# Patient Record
Sex: Male | Born: 1985 | Race: White | Hispanic: Yes | Marital: Married
Health system: Western US, Academic
[De-identification: ages and names within clinical notes are randomized; demographics above are authoritative.]

## PROBLEM LIST (undated history)

## (undated) ENCOUNTER — Encounter (HOSPITAL_BASED_OUTPATIENT_CLINIC_OR_DEPARTMENT_OTHER): Payer: Self-pay

## (undated) ENCOUNTER — Ambulatory Visit (HOSPITAL_BASED_OUTPATIENT_CLINIC_OR_DEPARTMENT_OTHER): Payer: Non-veteran care | Admitting: Anesthesiology

## (undated) SURGERY — PAIN TPI (2 OR MORE MUSCLES)
Laterality: Bilateral

---

## 2015-05-05 ENCOUNTER — Encounter (HOSPITAL_BASED_OUTPATIENT_CLINIC_OR_DEPARTMENT_OTHER): Payer: Self-pay | Admitting: Orthopedics

## 2015-05-05 DIAGNOSIS — M25571 Pain in right ankle and joints of right foot: Principal | ICD-10-CM

## 2015-05-16 ENCOUNTER — Ambulatory Visit
Admission: RE | Admit: 2015-05-16 | Discharge: 2015-05-16 | Disposition: A | Discharge status: Home / Self Care | Payer: Non-veteran care | Source: Ambulatory Visit | Attending: Diagnostic Radiology | Admitting: Diagnostic Radiology

## 2015-05-16 DIAGNOSIS — M25571 Pain in right ankle and joints of right foot: Principal | ICD-10-CM | POA: Insufficient documentation

## 2015-05-20 ENCOUNTER — Encounter (HOSPITAL_BASED_OUTPATIENT_CLINIC_OR_DEPARTMENT_OTHER): Payer: Self-pay | Admitting: Orthopedics

## 2015-05-20 DIAGNOSIS — M25571 Pain in right ankle and joints of right foot: Principal | ICD-10-CM

## 2015-06-09 ENCOUNTER — Encounter (HOSPITAL_BASED_OUTPATIENT_CLINIC_OR_DEPARTMENT_OTHER): Payer: Self-pay | Admitting: Internal Medicine

## 2015-06-09 DIAGNOSIS — M25571 Pain in right ankle and joints of right foot: Principal | ICD-10-CM

## 2015-06-12 ENCOUNTER — Ambulatory Visit
Admission: RE | Admit: 2015-06-12 | Discharge: 2015-06-12 | Disposition: A | Discharge status: Home / Self Care | Payer: Non-veteran care | Attending: Diagnostic Radiology | Admitting: Diagnostic Radiology

## 2015-06-12 DIAGNOSIS — M25571 Pain in right ankle and joints of right foot: Principal | ICD-10-CM | POA: Insufficient documentation

## 2016-04-09 ENCOUNTER — Ambulatory Visit (INDEPENDENT_AMBULATORY_CARE_PROVIDER_SITE_OTHER): Payer: Non-veteran care | Admitting: Interventional Pain Management

## 2016-04-27 ENCOUNTER — Ambulatory Visit (INDEPENDENT_AMBULATORY_CARE_PROVIDER_SITE_OTHER): Payer: Non-veteran care | Admitting: Pain Medicine

## 2016-05-18 ENCOUNTER — Ambulatory Visit (HOSPITAL_BASED_OUTPATIENT_CLINIC_OR_DEPARTMENT_OTHER): Payer: Non-veteran care | Admitting: Pain Medicine

## 2016-05-18 ENCOUNTER — Encounter (HOSPITAL_BASED_OUTPATIENT_CLINIC_OR_DEPARTMENT_OTHER): Payer: Self-pay

## 2017-09-26 ENCOUNTER — Telehealth (HOSPITAL_BASED_OUTPATIENT_CLINIC_OR_DEPARTMENT_OTHER): Payer: Self-pay

## 2017-09-26 NOTE — Telephone Encounter (Signed)
Pt is scheduled for consult on 10/17/17 with Dr. Tessie FassAhadian please notify [facility] for [facility].

## 2017-09-28 NOTE — Telephone Encounter (Signed)
Received [facility] approval for Pain Mgmnt services. Approval may not include all procedures. Patient aware.

## 2017-09-28 NOTE — Telephone Encounter (Signed)
Per Erik Barron from [facility] at [phone] advised he will be faxing approval letter.

## 2017-10-17 ENCOUNTER — Encounter (HOSPITAL_BASED_OUTPATIENT_CLINIC_OR_DEPARTMENT_OTHER): Payer: Self-pay | Admitting: Anesthesiology

## 2017-10-17 ENCOUNTER — Ambulatory Visit: Payer: Non-veteran care | Attending: Anesthesiology | Admitting: Anesthesiology

## 2017-10-17 DIAGNOSIS — M7918 Myalgia, other site: Secondary | ICD-10-CM | POA: Insufficient documentation

## 2017-10-17 DIAGNOSIS — S8401XS Injury of tibial nerve at lower leg level, right leg, sequela: Secondary | ICD-10-CM | POA: Insufficient documentation

## 2017-10-17 DIAGNOSIS — G90521 Complex regional pain syndrome I of right lower limb: Secondary | ICD-10-CM | POA: Insufficient documentation

## 2017-10-17 DIAGNOSIS — G5781 Other specified mononeuropathies of right lower limb: Secondary | ICD-10-CM | POA: Insufficient documentation

## 2017-10-17 DIAGNOSIS — S8421XS Injury of cutaneous sensory nerve at lower leg level, right leg, sequela: Secondary | ICD-10-CM | POA: Insufficient documentation

## 2017-10-17 DIAGNOSIS — S81801S Unspecified open wound, right lower leg, sequela: Secondary | ICD-10-CM | POA: Insufficient documentation

## 2017-10-17 NOTE — Patient Instructions (Signed)
Please allow up to 14 business days for the authorization to be processed and you will be contacted to schedule your procedure once it has been approved.    Please call [phone] To Check on Status of Authorization    Please call [phone] option #0 To Schedule Your Procedure once it's appoved    Follow Up Appointment  After procedure     If you have any questions please don't hesitate to call our clinic at [phone].      What is a Trigger Point?  These are tight, dysfunctional, and painful bands of muscle that are often located in the neck, upper back, and lower back.     These trigger points are usually very tender to applied pressure, and can often radiates pain outwards when pressed. Over time, this muscular tension can reduce blood flow to the painful regions and worsen the pain cycle.     This condition is often referred to as myofascial pain syndrome by healthcare providers.     What is a Trigger Point Injection?  A trigger point injection involves the use of a small needle to administer local anesthetic medication directly into painful areas within the muscles. This helps to relax the muscles and break the cycle of dysfunction. Your physician may also use a technique known as dry needling to help break up the tight tissue and stimulate increased blood flow to the area.      In certain cases, your physician may choose to inject Botox (botulinum toxin) into the trigger points. This medication reduces muscle contraction at a molecular level, and can also be very effective in treating myofascial pain.     How Are Trigger Point Injections Performed?  You will be asked to sit or lie in a position in which the affected are be accessible to your physician.     Cleaning solution is applied to site of the pain. Your physician will then use a small needle to inject some local anesthetic medication into each of the painful areas of the affected muscles.     For trigger point injections of certain  structures, such as the piriformis muscle, image guidance with either ultrasound or fluoroscopy (X-ray) will be utilized to ensure accurate placement of the needle.     Risks and Complications:  Trigger point injections are considered very safe in general.     However, as with any minor medical procedure, there are potential risks. This includes soreness, bleeding, bruising, or infection. If the procedure is done in the upper back or chest, then pneumothorax (collapsed lung) is also a potential rare complication.     We will take every measure to minimize these potential risks and maximize the therapeutic benefit.
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
## 2017-10-20 ENCOUNTER — Telehealth (HOSPITAL_BASED_OUTPATIENT_CLINIC_OR_DEPARTMENT_OTHER): Payer: Self-pay | Admitting: Anesthesiology

## 2017-10-20 NOTE — Telephone Encounter (Addendum)
Pt has an order for TPI and previous authorization does not have CPT code [id number] for TPI. A new SAR needs to be requested through [facility].

## 2017-10-20 NOTE — Telephone Encounter (Signed)
Erik Barron from [facility] is asking if clinic can faxed over progress notes to [facility], please advise.

## 2017-10-20 NOTE — Telephone Encounter (Signed)
Pt called to schedule, feels it should have been approved, the order for injection.  Wanted to speak with the auth team.  Transferred to clinic.

## 2017-10-20 NOTE — Telephone Encounter (Signed)
Patient is following up on order placed 10/17/17 for TPI. Pt is under the impression that the procedure is already approved by [facility] from the initial referral. Pt aware that clinic has not submitted auth to insurance, he would like a call back with a status update, he would also like Dr. Tessie FassAhadian to get involved in the process because he states "Dr. Tessie FassAhadian can make calls to certain people to get this approved". Please advise, thank you.

## 2017-10-26 NOTE — Telephone Encounter (Signed)
Notes from 8/26 faxed to Attn. Dekuisha at [phone]

## 2017-12-05 NOTE — Telephone Encounter (Signed)
Spoke to [employer] and asked if the cpt code for TPI can be added in the recent authorization ([id number]). Per agent it is already included in the existing authorization (see below)      Informed agent,cpt code 29528 is not included in the list however.  Also informed agent that the TPI has actually been approved by the [place] from the first referral they sent (see below).  Agent will update current auth to reflect cpt code 41324.  The update Berkley Harvey will be faxed as soon as it's edited.

## 2017-12-08 NOTE — Telephone Encounter (Signed)
Spoke to [employer] M who stated that the correct authorization to use is auth# [id number] and NOT the [id number].    TPI has been APPROVED.    Patient has been notified. Per patient, he already moved and actually discussed this with Dr. Tessie Fass before. Patient inquired as to how he can get his medical records from our Clinic.  Informed him, he needs to sign the ROI.  Advised him to go to the [place] website and choose the link where you can request your records online.    Patient thankful for the assistance.    Closing this encounter, matter resolved.

## 2019-09-22 IMAGING — CR L-SPINE 4 VWS MIN
1 series · 5 of 5 positions shown · non-contrast
Comparison: None

HISTORY/INDICATION:  Lumbar radiculopathy
TECHNIQUE: Lumbar spine, 5 views

[Series 1: t lumbar spine ap · 0.15mm/px · 5 of 5 slices shown]
[im 1/5]
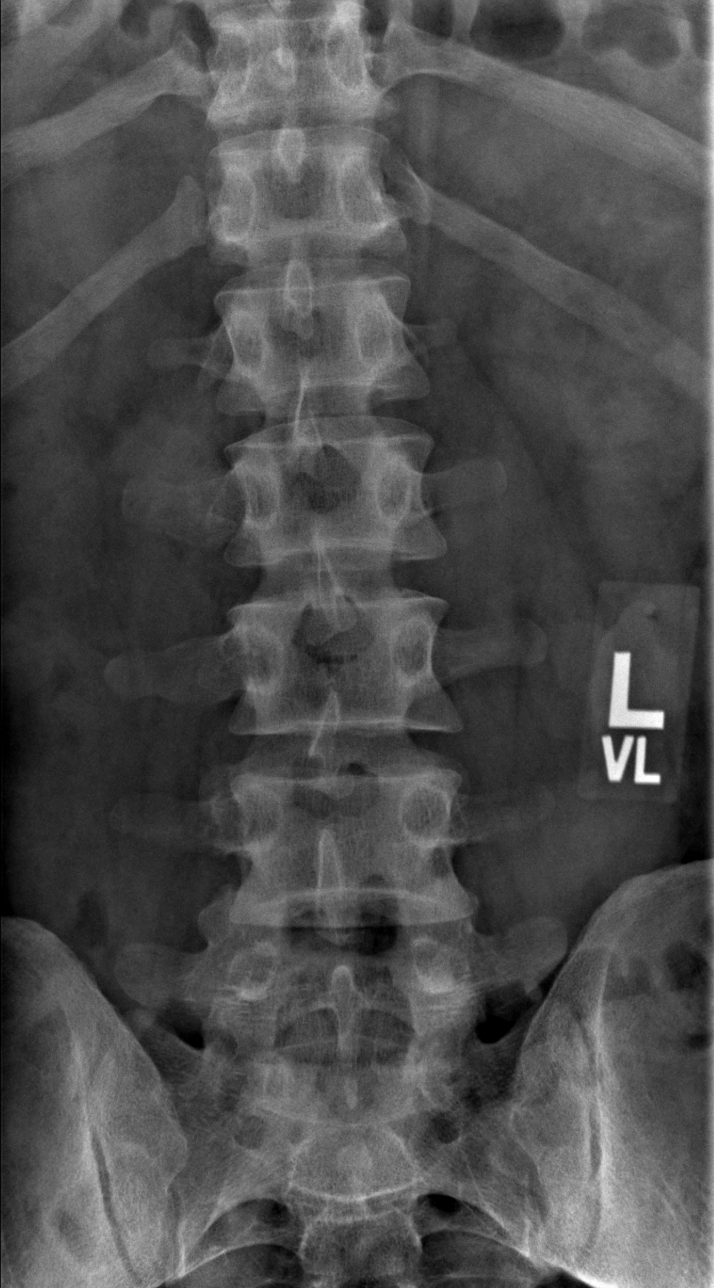
[im 2/5]
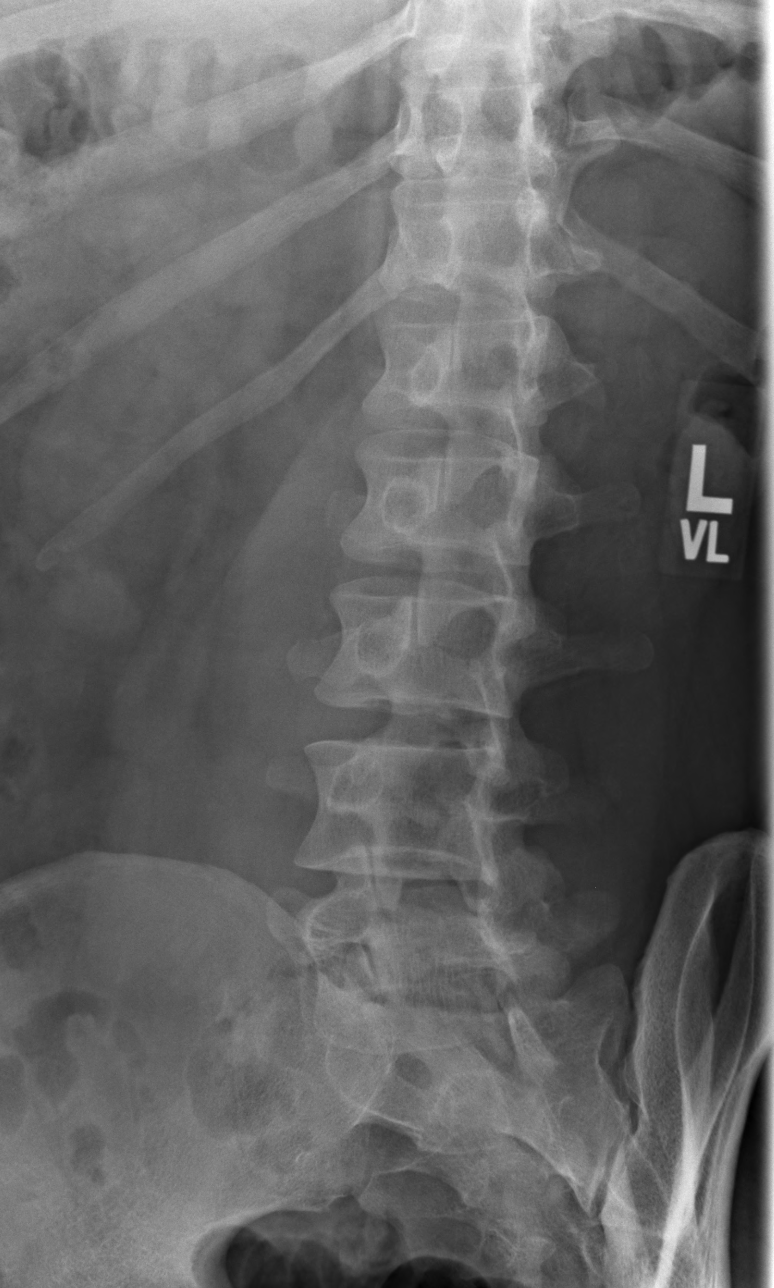
[im 3/5]
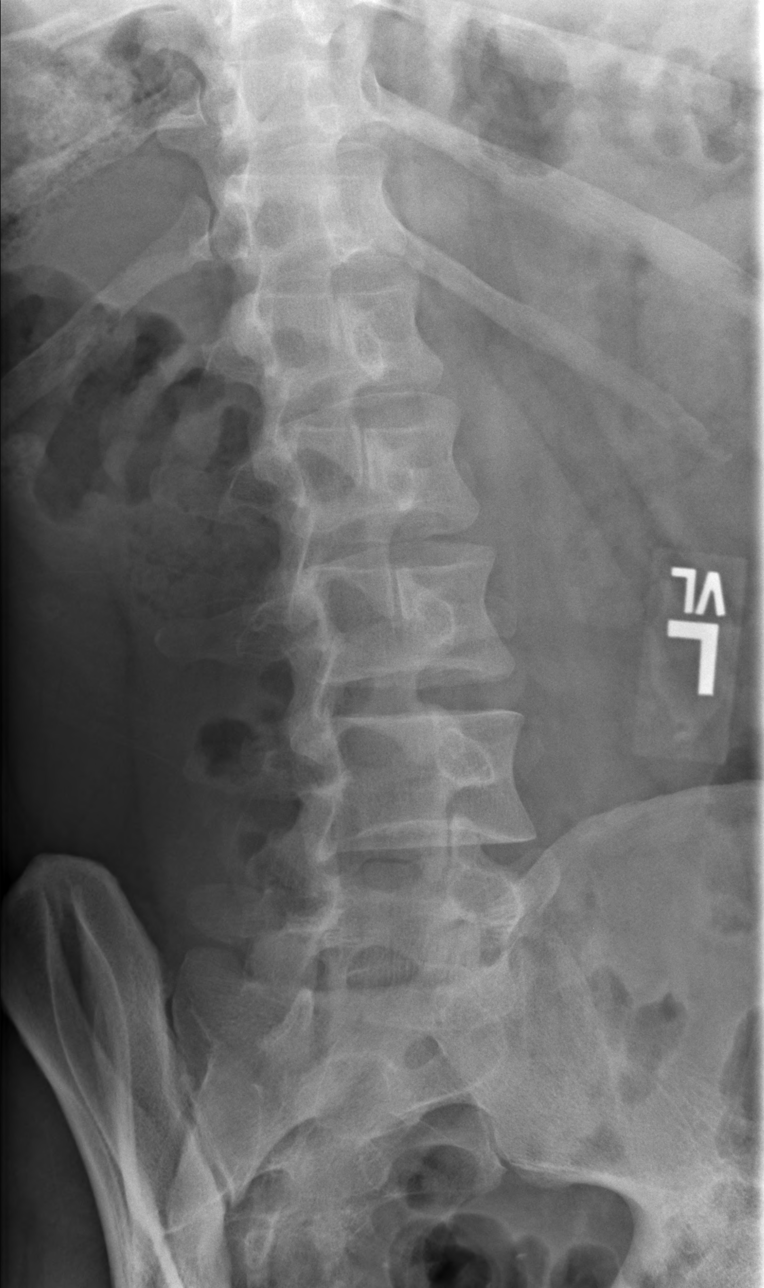
[im 4/5]
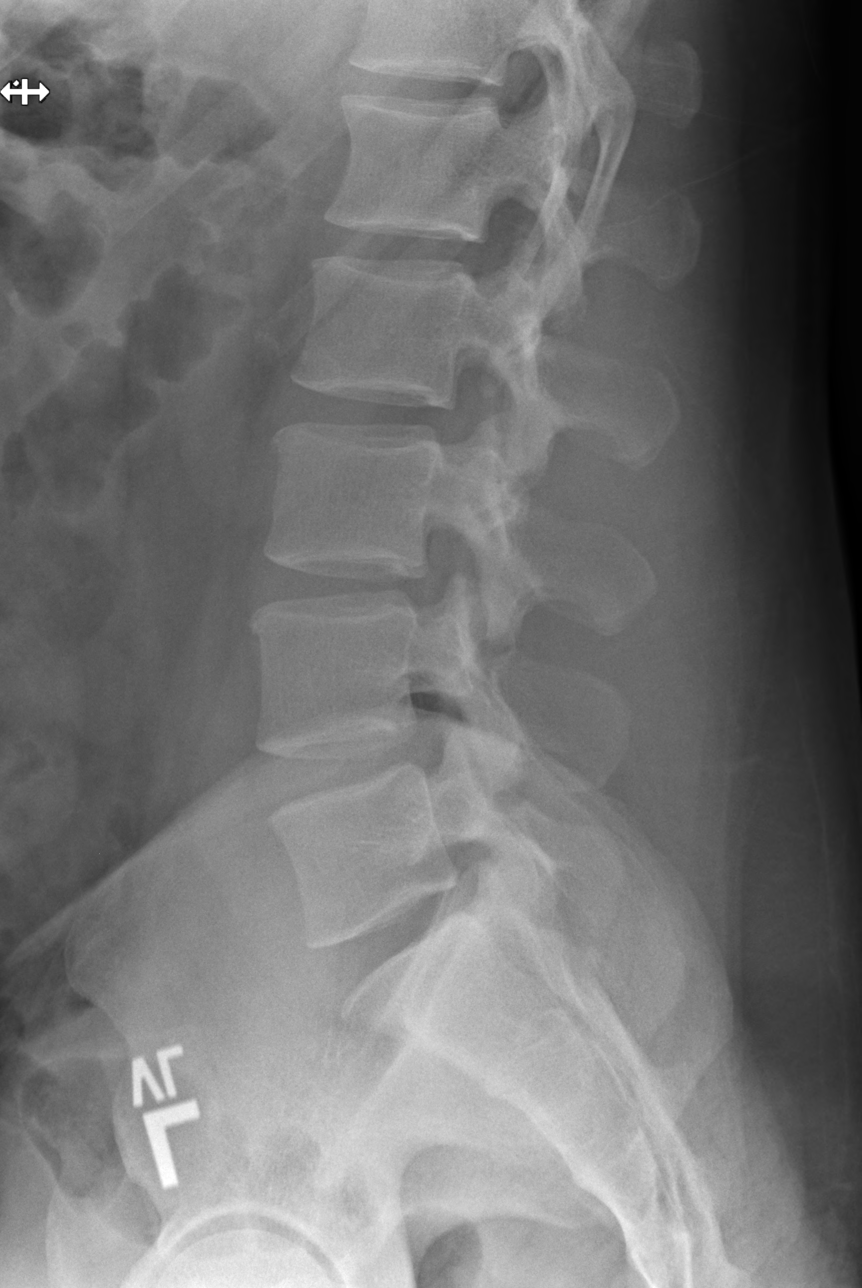
[im 5/5]
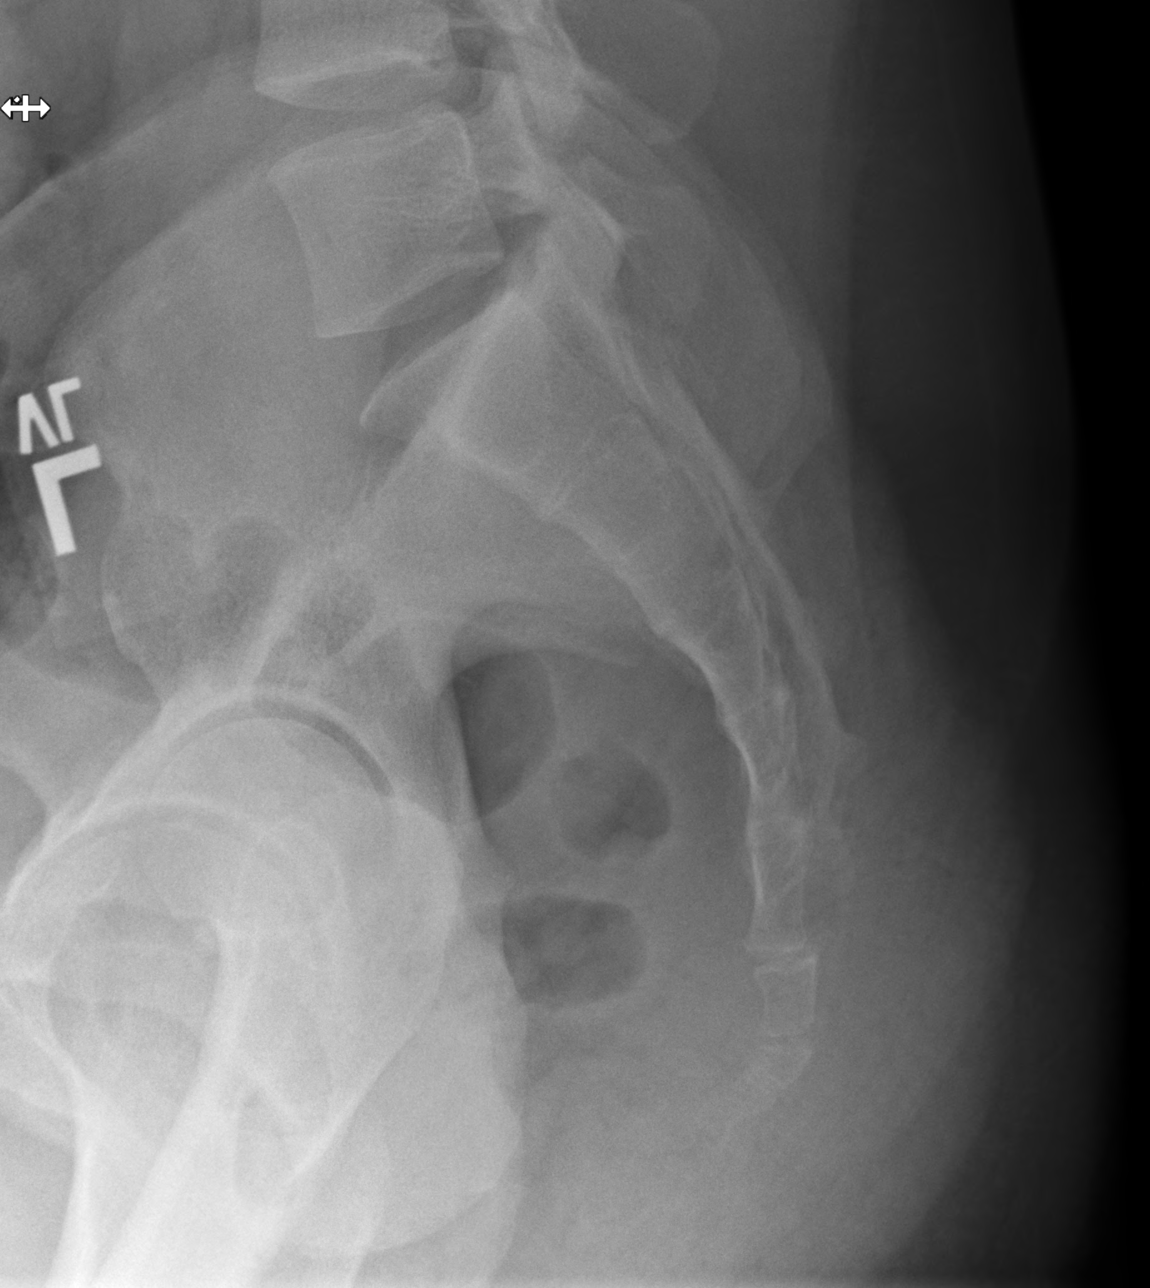

[5 of 5 positions shown; findings below may reference images not displayed]

FINDINGS: 5 lumbarized segments are present. No congenital anomalies are seen. Disc spaces and facet joints are normal. No fracture, spondylolysis or spondylolisthesis is present.
IMPRESSION: Normal lumbar spine.

## 2019-09-22 IMAGING — MR MRI LSPINE WO CONTRAST
6 series · 45 of 48 positions shown · IV contrast (agent unspecified)
Comparison: Lumbar spine radiographs 09/22/2019.

HISTORY: 33-year-old male with chronic low back pain and right leg pain.
TECHNIQUE: Multiplanar, multisequence MR images of the lumbar spine were obtained without intravenous contrast.

CONTRAST: None.

[Series 11: iii_aaspine_lspine_mpr_cor · coronal · 1.7mm · 1.67mm/px · 19 of 80 slices shown]
[im 1/80]
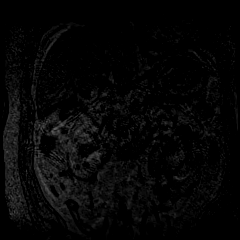
[im 5/80]
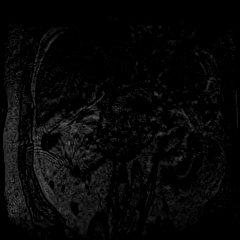
[im 9/80]
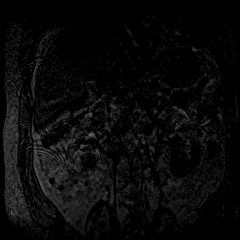
[im 14/80]
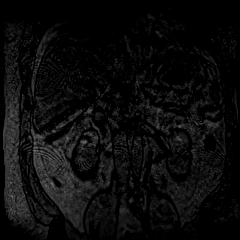
[im 18/80]
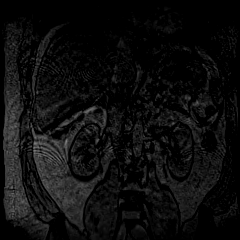
[im 22/80]
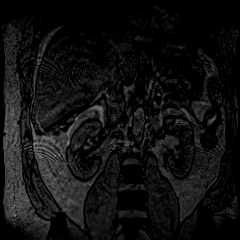
[im 27/80]
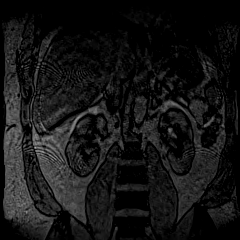
[im 31/80]
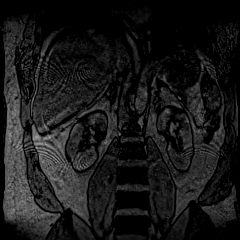
[im 36/80]
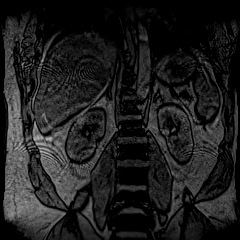
[im 40/80]
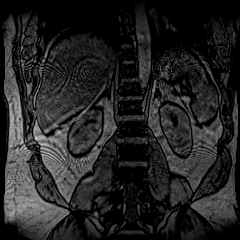
[im 44/80]
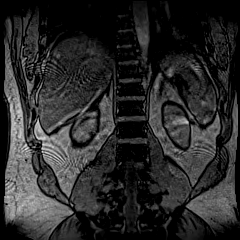
[im 49/80]
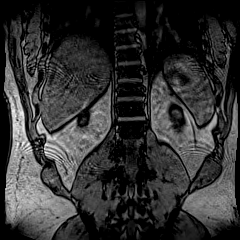
[im 53/80]
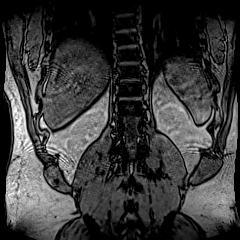
[im 58/80]
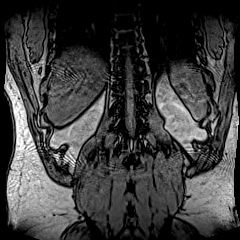
[im 62/80]
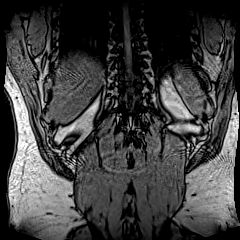
[im 66/80]
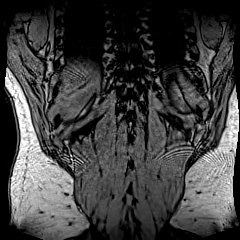
[im 71/80]
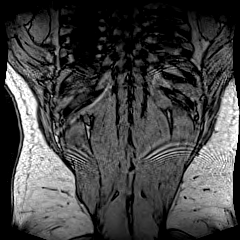
[im 75/80]
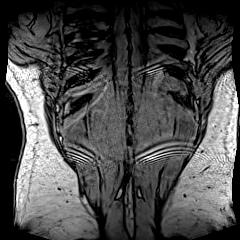
[im 80/80]
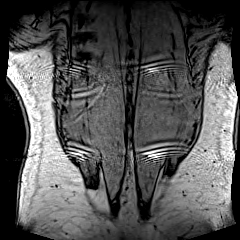

[Series 23: t2_sag · sagittal · 4.0mm · 0.68mm/px · 4 of 15 slices shown]
[im 1/15]
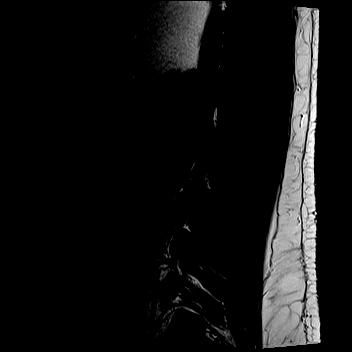
[im 5/15]
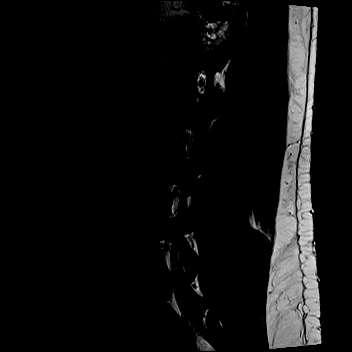
[im 10/15]
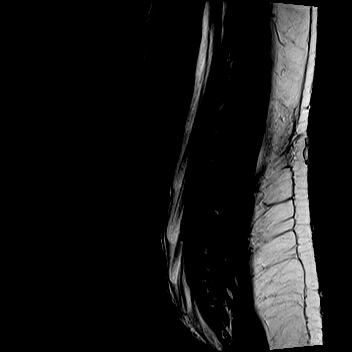
[im 15/15]
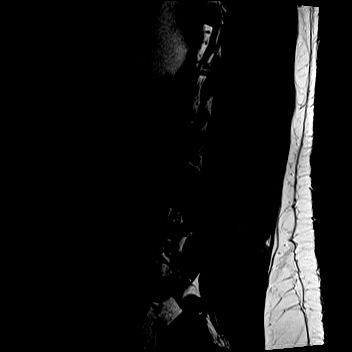

[Series 24: t1_sag · sagittal · 4.0mm · 0.75mm/px · 4 of 15 slices shown]
[im 1/15]
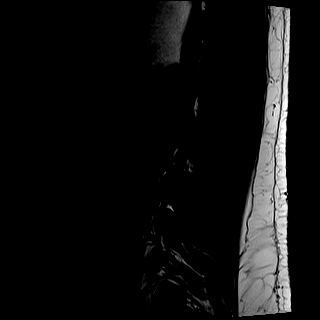
[im 5/15]
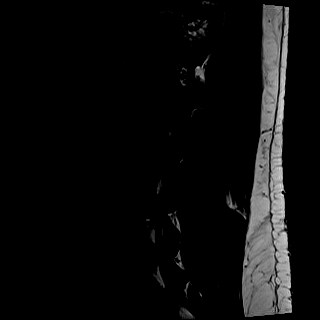
[im 10/15]
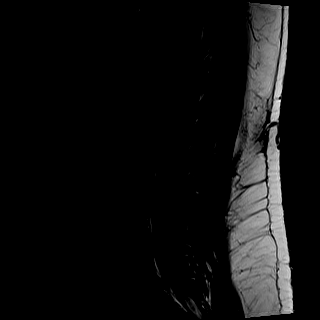
[im 15/15]
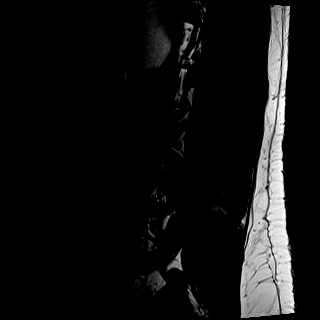

[Series 25: ir_sag · sagittal · 4.0mm · 0.94mm/px · 4 of 15 slices shown]
[im 1/15]
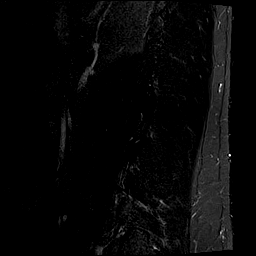
[im 5/15]
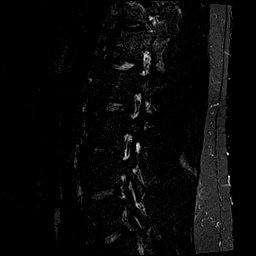
[im 10/15]
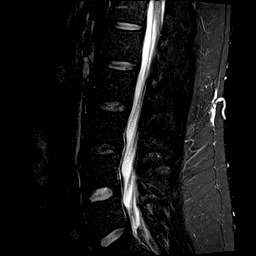
[im 15/15]
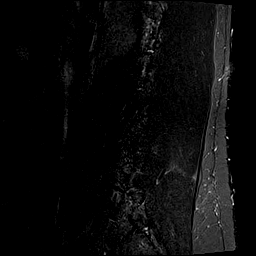

[Series 26: t2_axial · axial · 4.0mm · 0.62mm/px · z∈[-634,-395]mm · 8 of 46 slices shown]
[im 1/46]
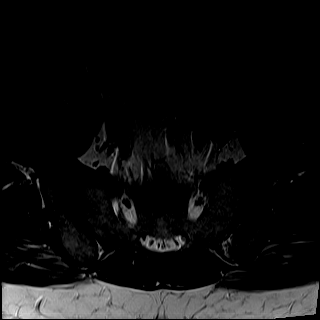
[im 10/46]
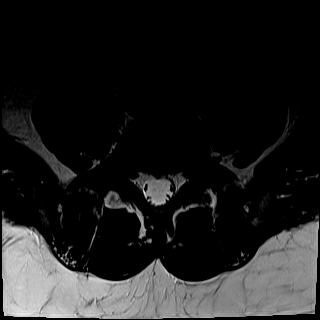
[im 14/46]
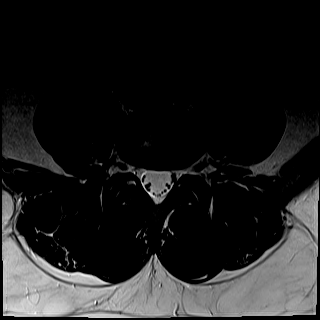
[im 19/46]
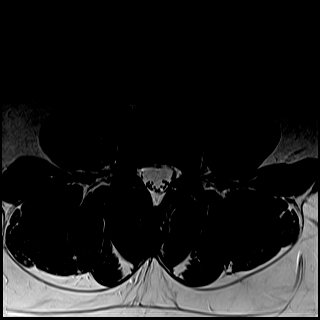
[im 28/46]
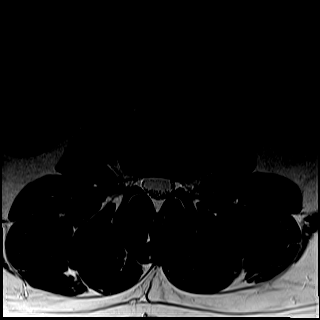
[im 32/46]
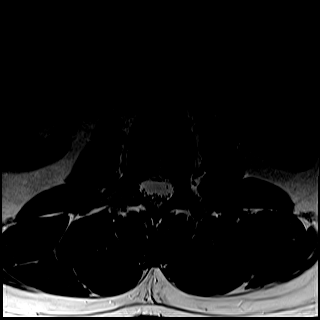
[im 37/46]
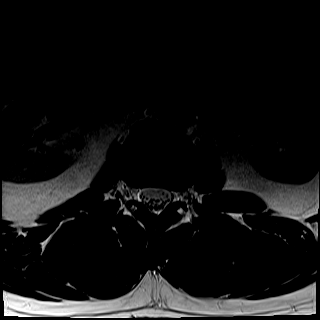
[im 46/46]
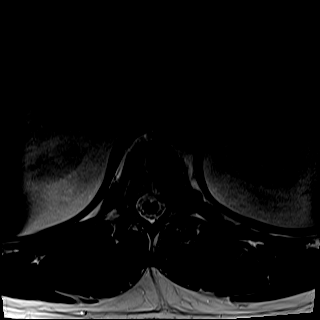

[Series 27: t1_axial_obl · axial · 3.0mm · 0.43mm/px · z∈[-672,-439]mm · 6 of 26 slices shown]
[im 1/26]
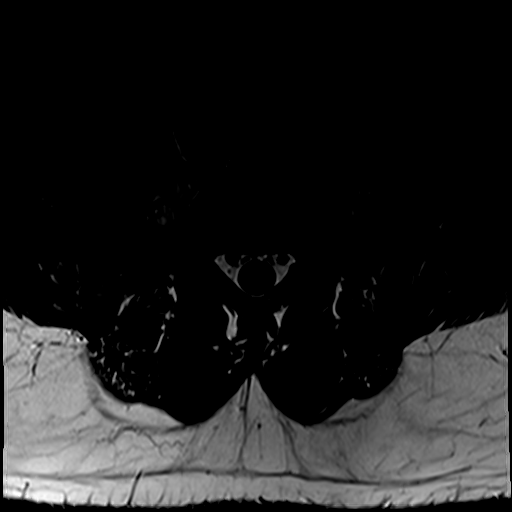
[im 6/26]
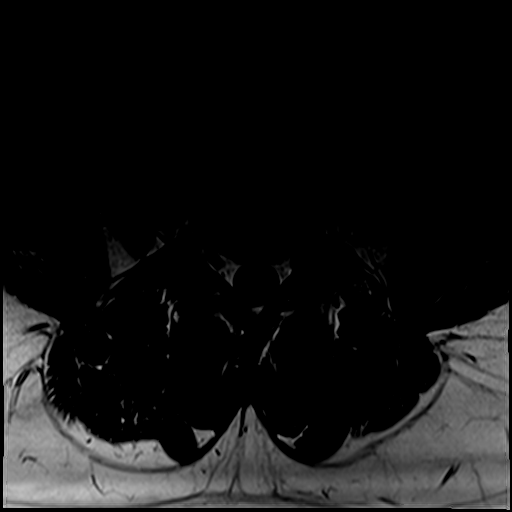
[im 11/26]
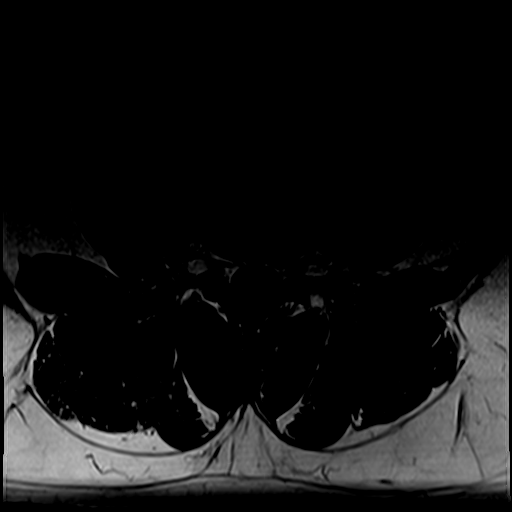
[im 16/26]
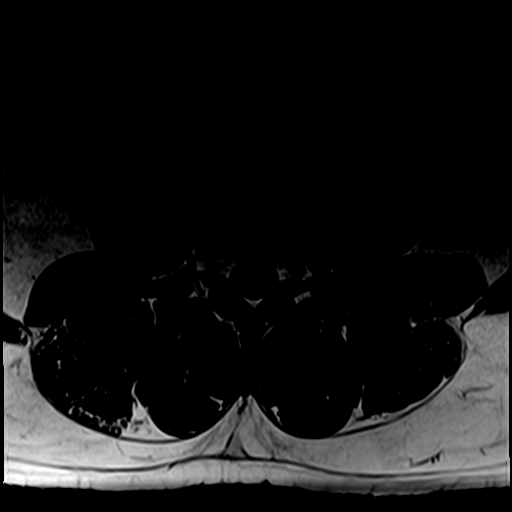
[im 21/26]
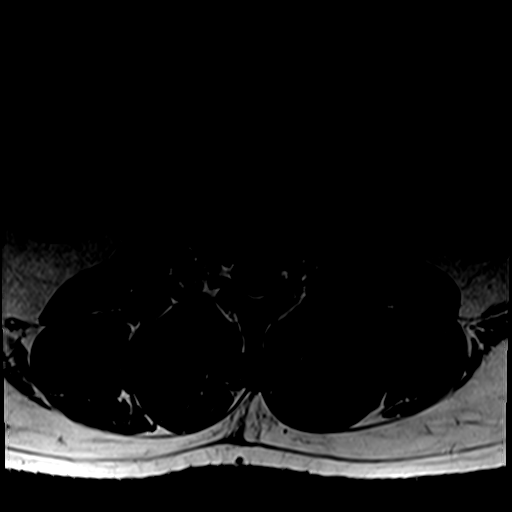
[im 26/26]
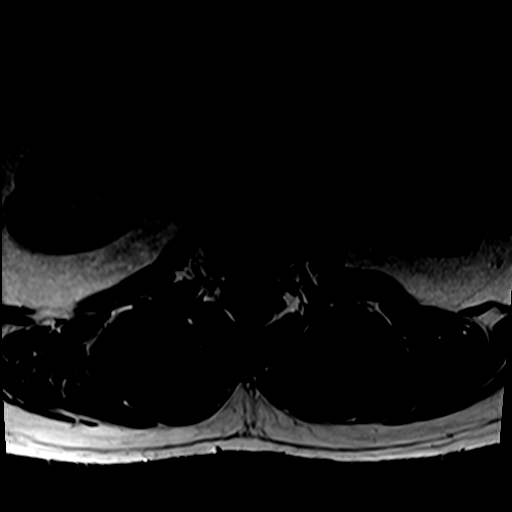

[45 of 48 positions shown; findings below may reference images not displayed]

FINDINGS: COUNT/LABELING: Please note that spinal labeling was performed assuming there are 5 non-rib-bearing lumbar type vertebrae.

ALIGNMENT: No subluxations.

VERTEBRAL BODY HEIGHTS: Maintained.

MARROW SIGNAL: No marrow signal changes.

INTERVERTEBRAL DISCS: No significant height loss.

FACET JOINTS: Mild facet arthropathy at L4-5 and L5-S1.

PARASPINAL SOFT TISSUES: No paraspinal soft tissue signal abnormalities.

DISTAL THORACIC SPINAL CORD AND CONUS MEDULLARIS: The distal thoracic spinal cord is normal in caliber and signal with the conus medullaris terminating at L1, which is normal.

CAUDA EQUINA NERVE ROOTS: Unremarkable.

FINDINGS BY LEVEL:

T12-L1: No high-grade canal stenosis or foraminal narrowing.

L1-L2:  No high-grade canal stenosis or foraminal narrowing.

L2-L3:  No high-grade canal stenosis or foraminal narrowing.

L3-L4:  No high-grade canal stenosis or foraminal narrowing. There is a right extraforaminal disc protrusion, which abuts and possibly compresses the exiting right L3 nerve root.

L4-L5:  No high-grade canal stenosis or foraminal narrowing.

L5-S1:  No high-grade canal stenosis or foraminal narrowing.

OTHER: Mild bilateral SI joint osteoarthritis.
IMPRESSION: 1.
Right L3-4 extraforaminal disc protrusion abuts and possibly compresses the exiting right L3 nerve root.

2.
Mild bilateral SI joint osteoarthritis.

## 2020-08-05 IMAGING — CR L-SPINE 4 VWS MIN
1 series · 5 of 5 positions shown · non-contrast
Comparison: 09/22/19

Lumbar spine 5 views
HISTORY: Lumbar radiculopathy

[Series 1: ap · 0.17mm/px · 5 of 5 slices shown]
[im 1/5]
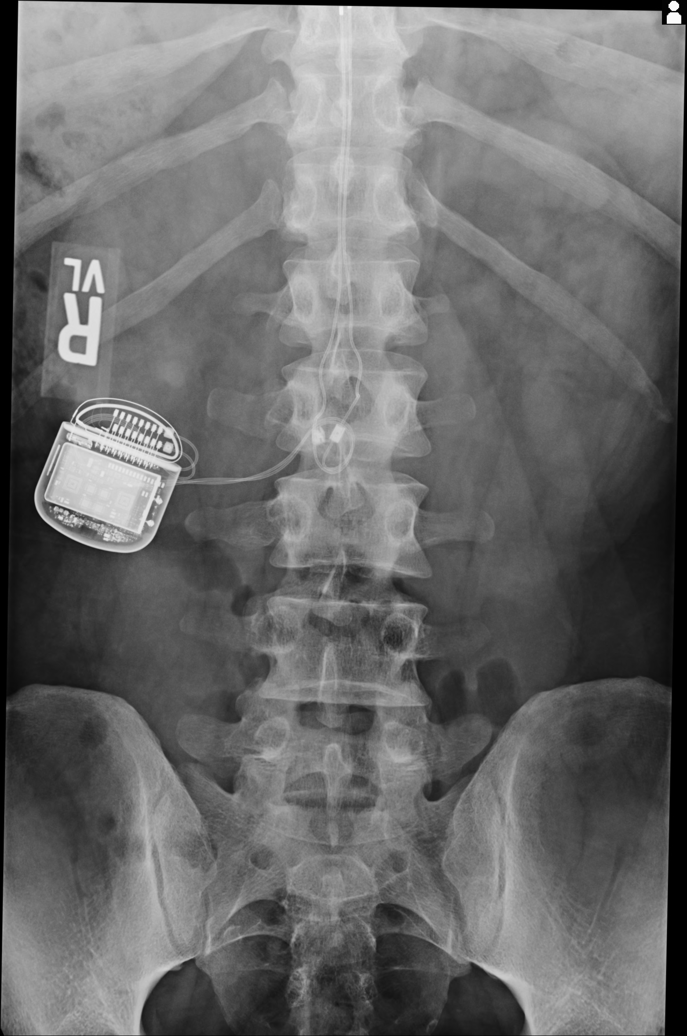
[im 2/5]
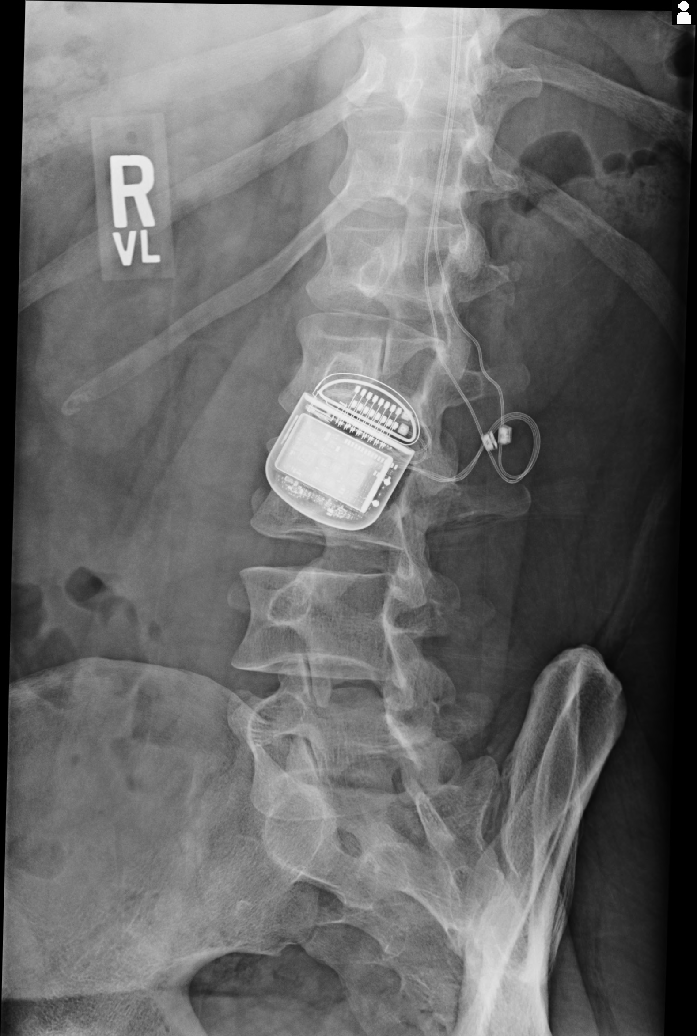
[im 3/5]
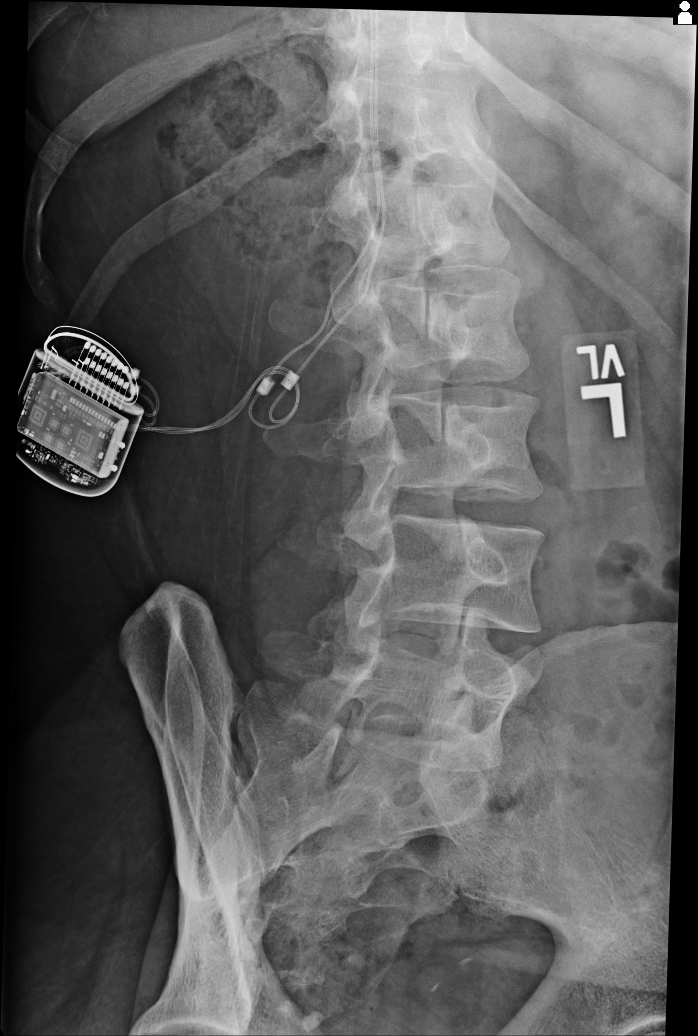
[im 4/5]
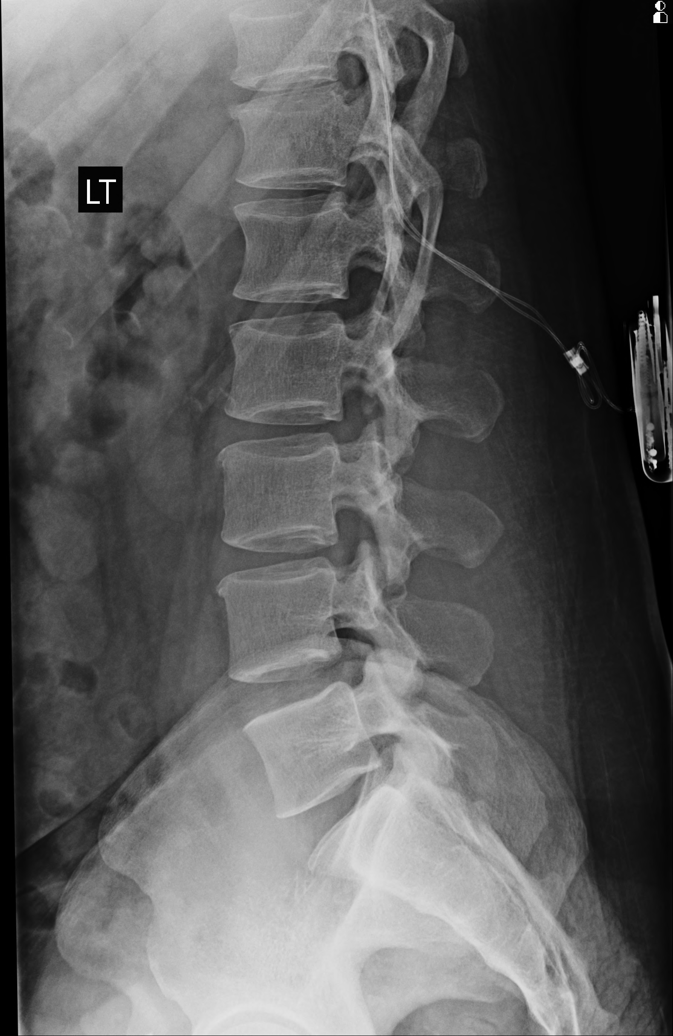
[im 5/5]
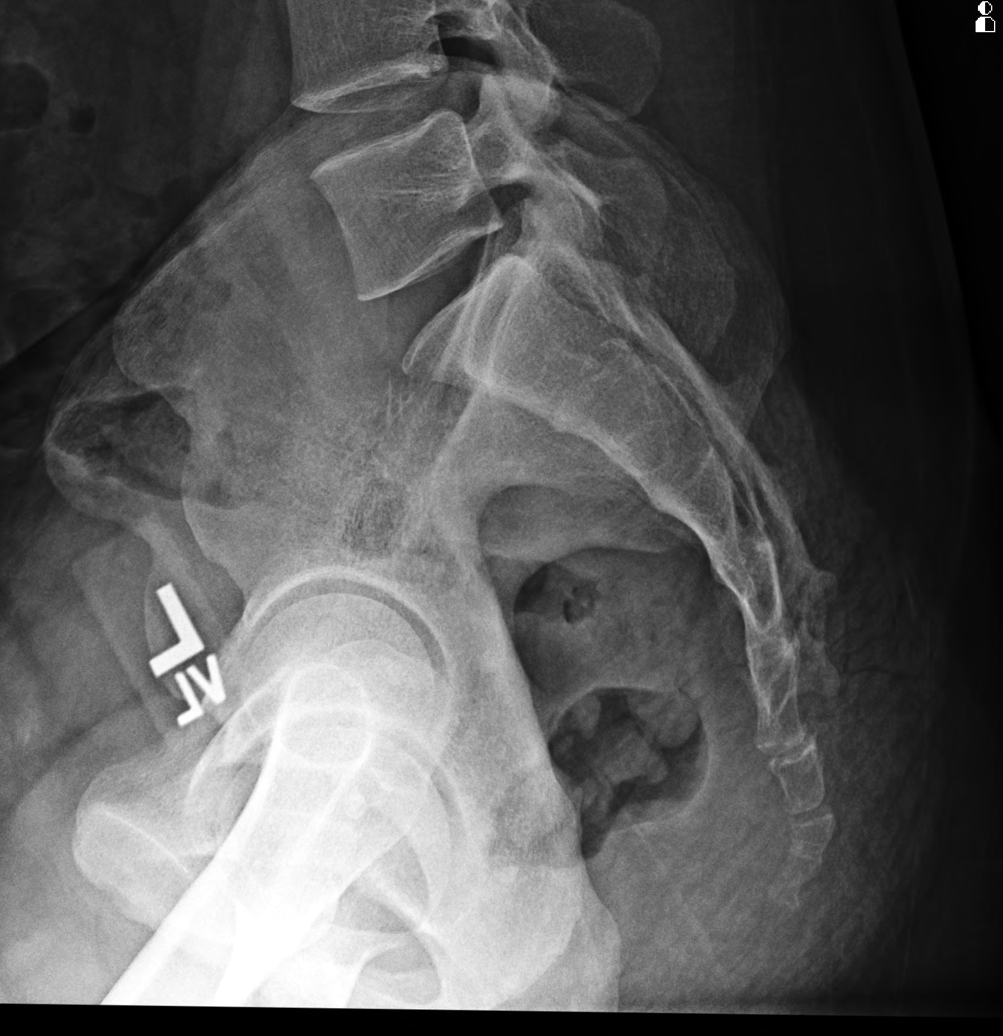

[5 of 5 positions shown; findings below may reference images not displayed]

FINDINGS: There are five lumbar segments.  There is no fracture, spondylosis or spondylolisthesis.  The disc spaces and sacroiliac joints are normal. Thoracic epidural stimulator placed in the interim
IMPRESSION: Normal lumbar spine.

## 2021-05-08 IMAGING — MR MRI LSPINE WO CONTRAST
5 series · 48 of 48 positions shown · non-contrast
Comparison: Previous MRI lumbar spine dated September 22, 2019 with correlation with lumbar spine x-rays August 05, 2020 that show 5 lumbar-type vertebrae with neurostimulator in place.

INDICATION: Low back and lower extremity pain.
TECHNIQUE: Sagittal and axial multisequence MR images of the lumbar spine were performed without intravenous contrast with additional noncontrast coronal T1-weighted images.

[Series 16: t2_sag · sagittal · 4.0mm · 0.74mm/px · 6 of 16 slices shown]
[im 1/16]
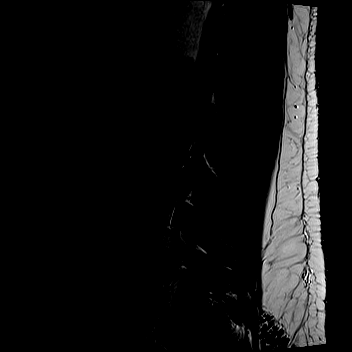
[im 4/16]
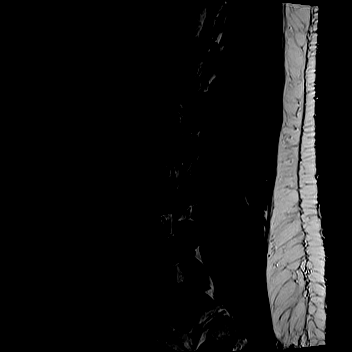
[im 7/16]
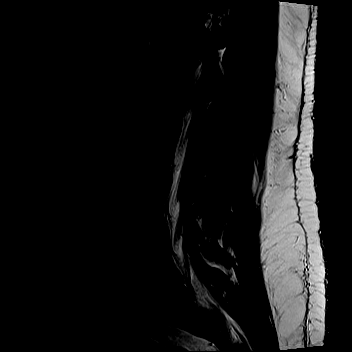
[im 10/16]
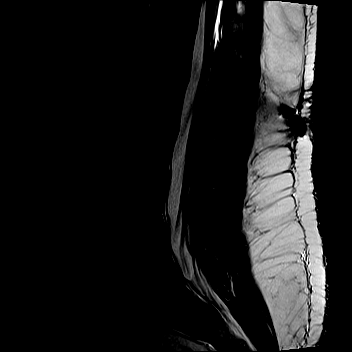
[im 13/16]
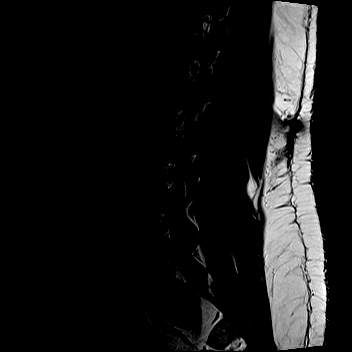
[im 16/16]
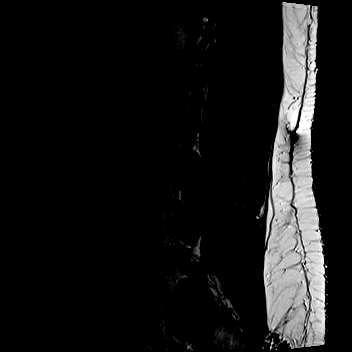

[Series 17: t1_sag · sagittal · 4.0mm · 0.81mm/px · 7 of 16 slices shown]
[im 1/16]
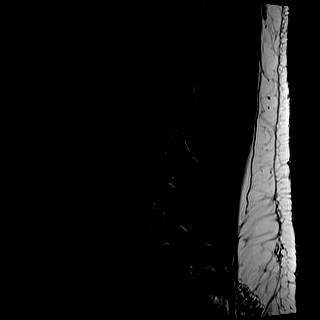
[im 3/16]
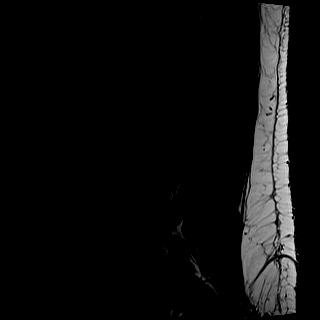
[im 6/16]
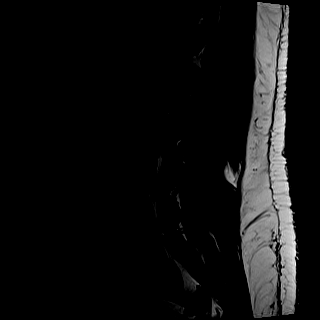
[im 8/16]
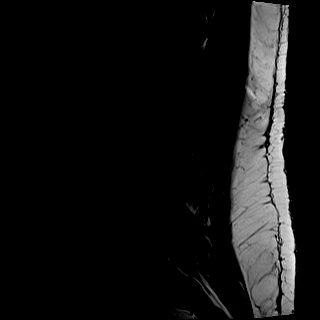
[im 11/16]
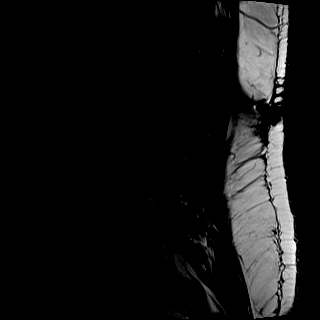
[im 13/16]
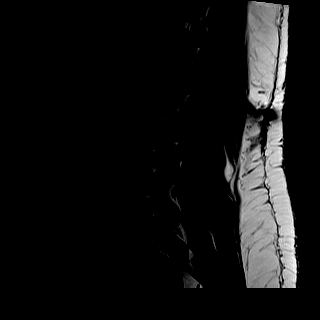
[im 16/16]
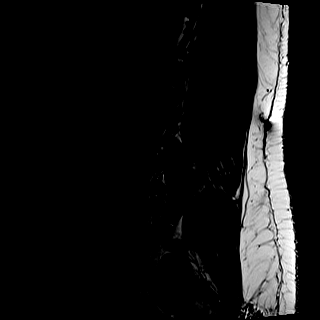

[Series 18: ir_sag · sagittal · 4.0mm · 0.90mm/px · 7 of 16 slices shown]
[im 1/16]
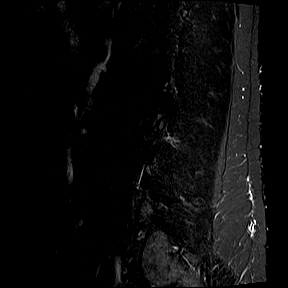
[im 3/16]
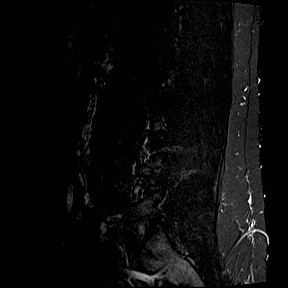
[im 6/16]
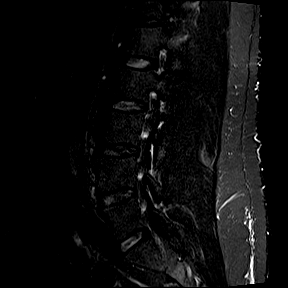
[im 8/16]
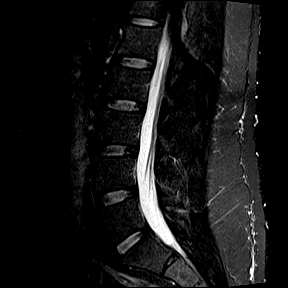
[im 11/16]
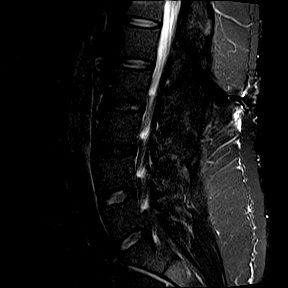
[im 13/16]
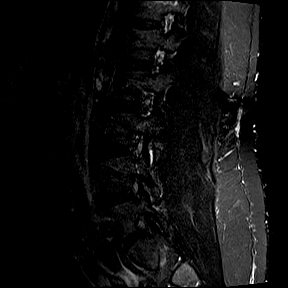
[im 16/16]
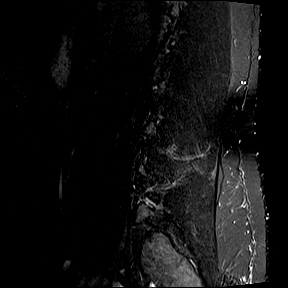

[Series 19: t2_axial · axial · 4.0mm · 0.66mm/px · z∈[-592,-388]mm · 17 of 42 slices shown]
[im 1/42]
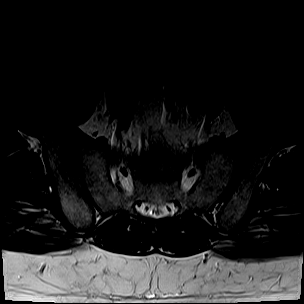
[im 3/42]
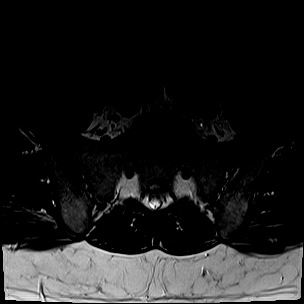
[im 6/42]
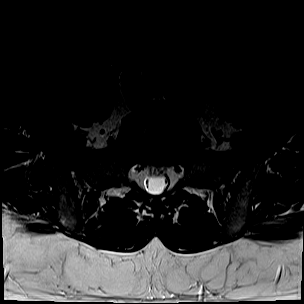
[im 8/42]
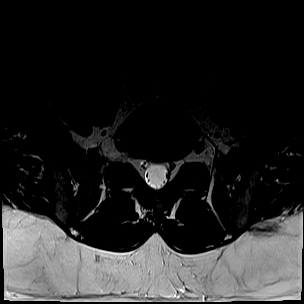
[im 11/42]
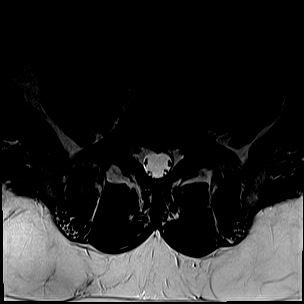
[im 13/42]
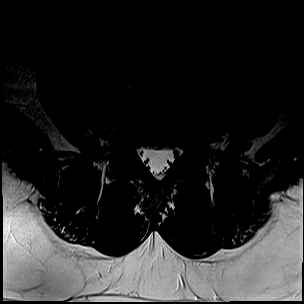
[im 16/42]
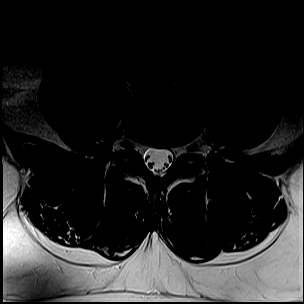
[im 18/42]
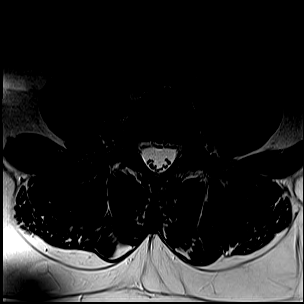
[im 21/42]
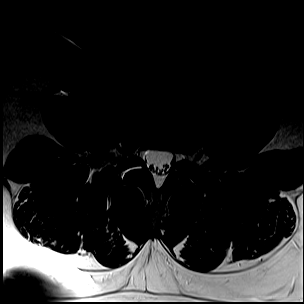
[im 24/42]
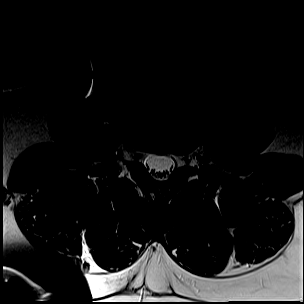
[im 26/42]
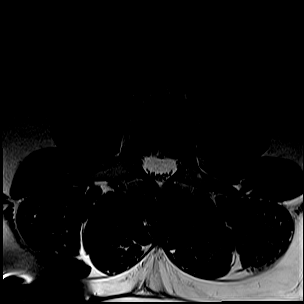
[im 29/42]
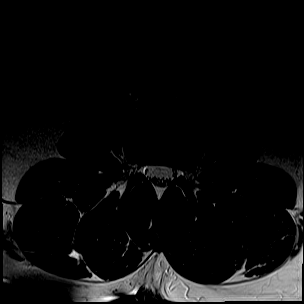
[im 31/42]
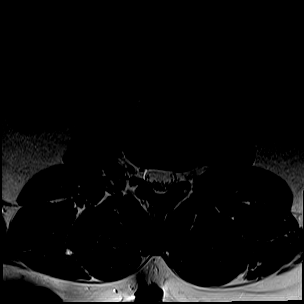
[im 34/42]
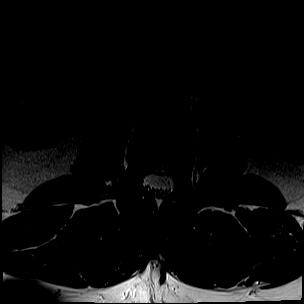
[im 36/42]
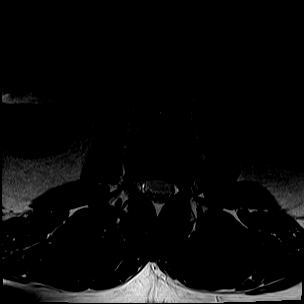
[im 39/42]
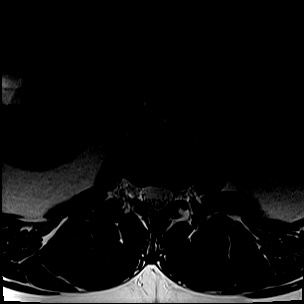
[im 42/42]
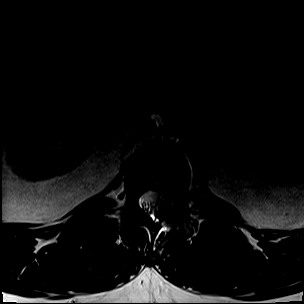

[Series 20: t1_axial_obl · axial · 3.0mm · 0.78mm/px · z∈[-613,-381]mm · 11 of 26 slices shown]
[im 1/26]
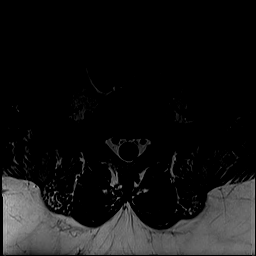
[im 3/26]
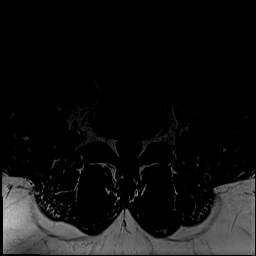
[im 6/26]
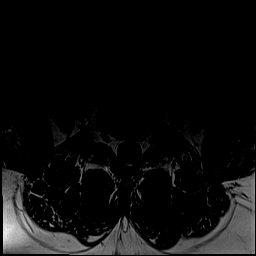
[im 8/26]
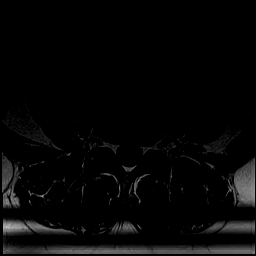
[im 11/26]
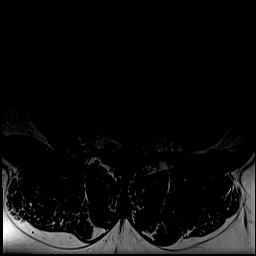
[im 13/26]
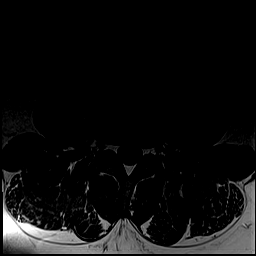
[im 16/26]
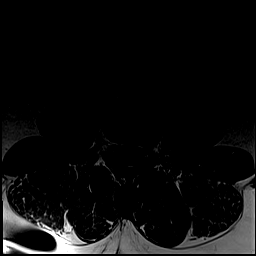
[im 18/26]
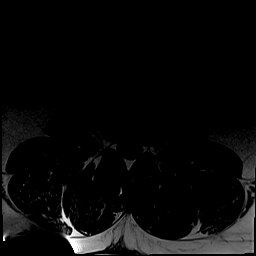
[im 21/26]
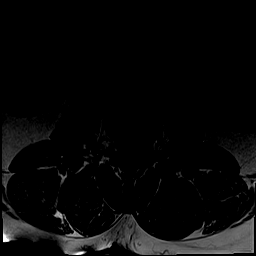
[im 23/26]
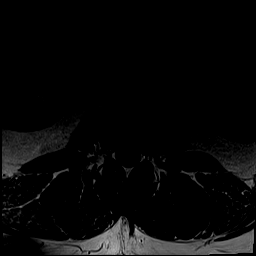
[im 26/26]
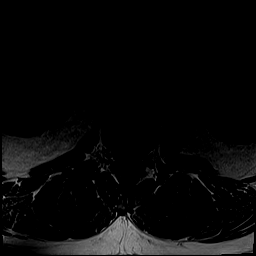

[48 of 48 positions shown; findings below may reference images not displayed]

FINDINGS: From the lateral scout image annotation patient has 7 cervical vertebrae, 12 thoracic vertebrae and 5 lumbar-type vertebrae.

 There is normal alignment of the vertebral elements with no anterior or posterior listhesis. Metallic signal misregistration artifact is seen in the right posterior aspect of the upper lumbar spine with metallic signal misregistration artifacts seen extending into the visualized posterior spinal canal at T12-L1 interspace level from patient's neurostimulator. The neurostimulator is MR compatible and it was positioned in MR Barb mode. The signal intensity of the bone marrow and distal neural cord is normal with normal conus ending at L1 pedicle level. Cauda equina is normal. Spinal canal is of normal caliber. There is no disc herniation, spinal canal or foraminal stenosis. The sacroiliac joints are normal. No paraspinal or epidural soft tissue mass or fluid collection is seen. Signal intensity of the paraspinal muscles is normal.
IMPRESSION: Negative MRI of the lumbar spine without contrast.

## 2021-05-08 IMAGING — CR [HOSPITAL] KUB
1 series · 2 of 2 positions shown · non-contrast
Comparison: None

HISTORY/INDICATION:  Evaluation of stimulator; MRI clearance.
TECHNIQUE: Abdomen, 1 view, no charge

[Series 1: AP · 0.17mm/px · 2 of 2 slices shown]
[im 1/2]
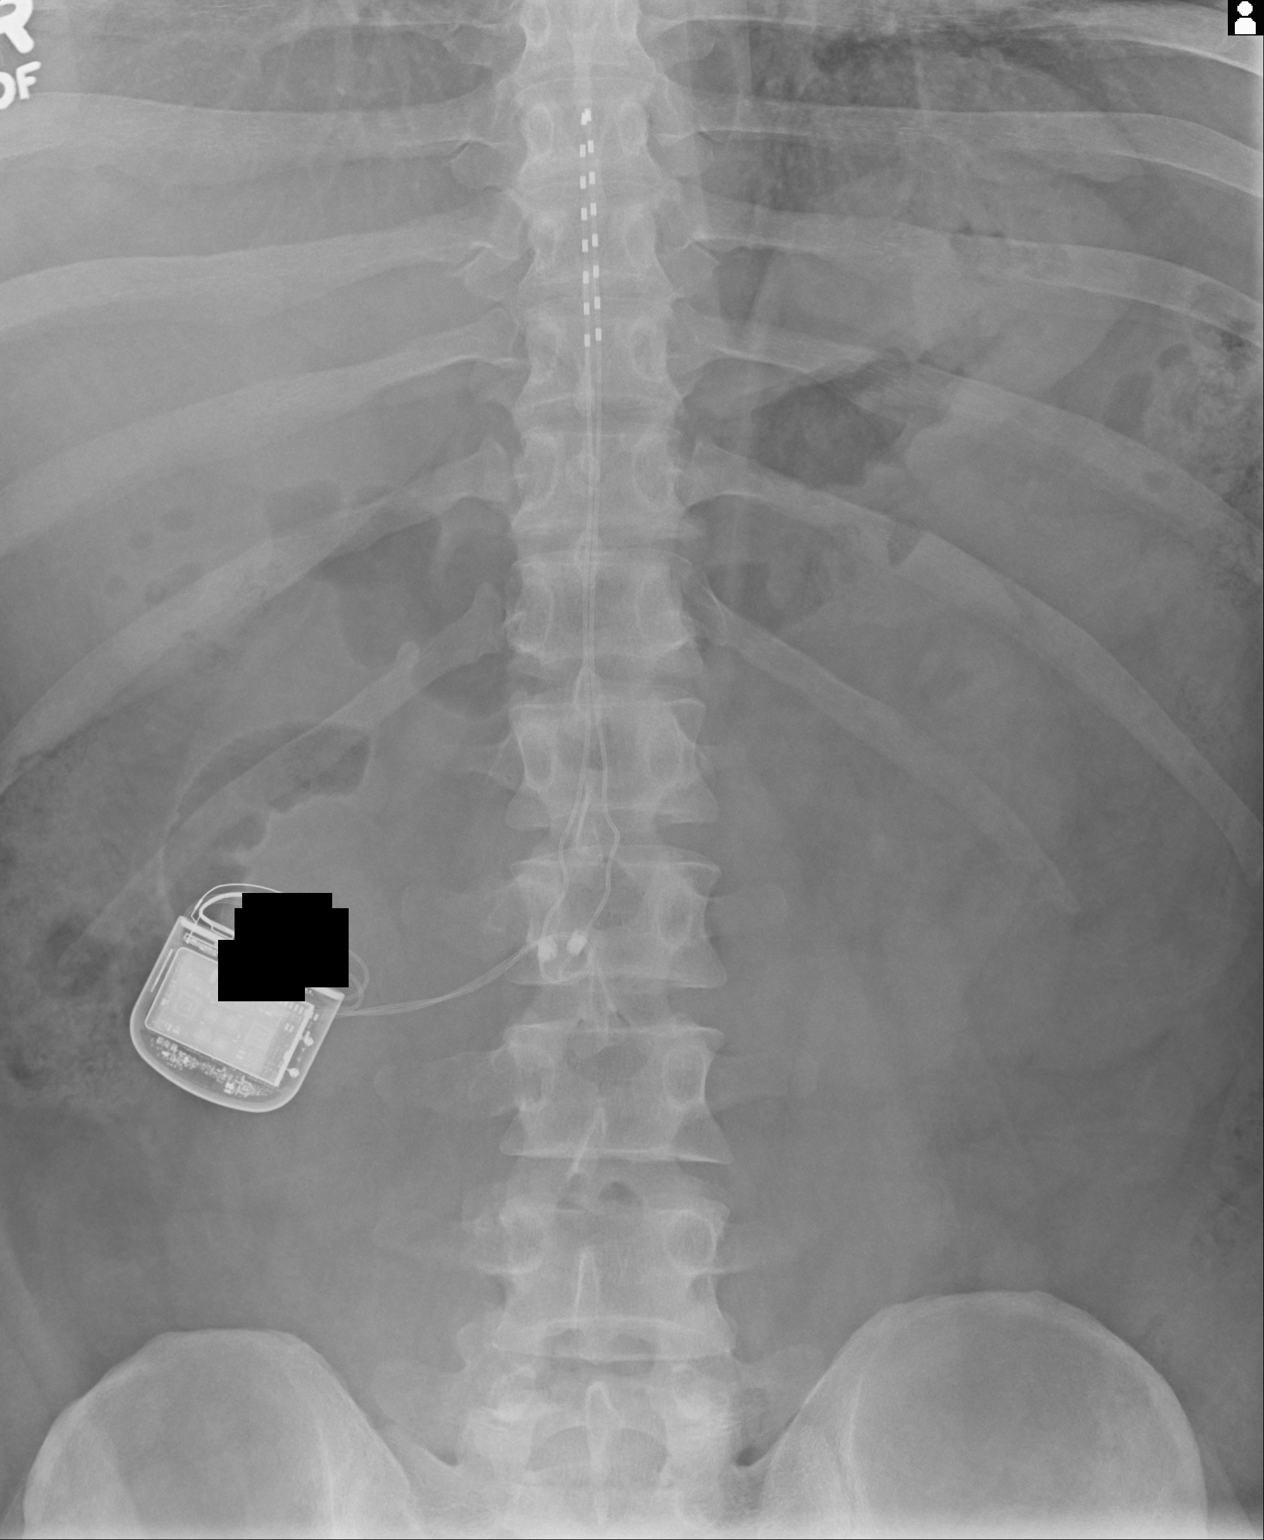
[im 2/2]
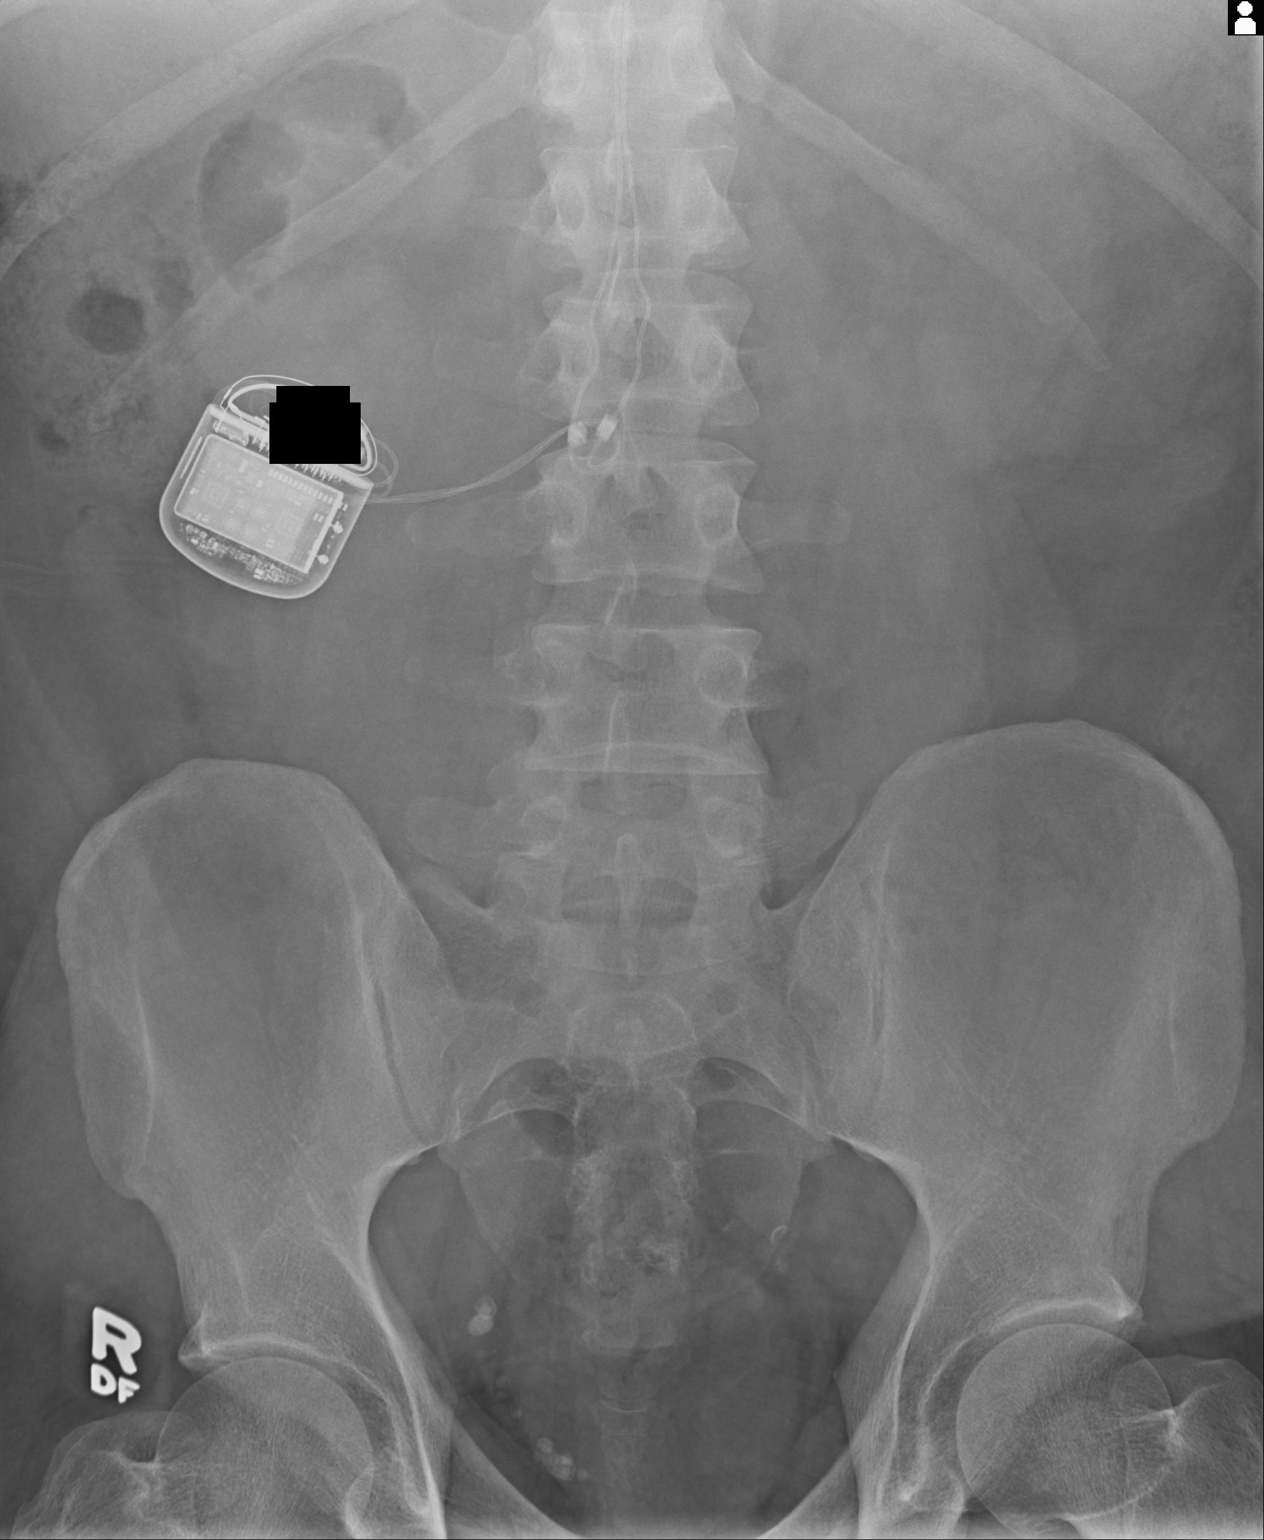

[2 of 2 positions shown; findings below may reference images not displayed]

FINDINGS: Battery pack for stimulator is seen in the left posterior back region with leads in the epidural space of the thoracic spine with tips located at the upper T8 vertebral segment.

Bowel gas pattern is normal. No organomegaly or bone findings are seen.
IMPRESSION: Normal stimulator as noted above, with no contraindications to MR.

## 2022-10-04 IMAGING — CR [HOSPITAL] KUB
1 series · 2 of 2 positions shown · non-contrast
Comparison: None.

Images Obtained from [HOSPITAL] Imaging
HISTORY/INDICATIONS: Pre-MRI screening.
TECHNIQUE: KUB 2 views

[Series 1: t abdomen · 0.15mm/px · 2 of 2 slices shown]
[im 1/2]
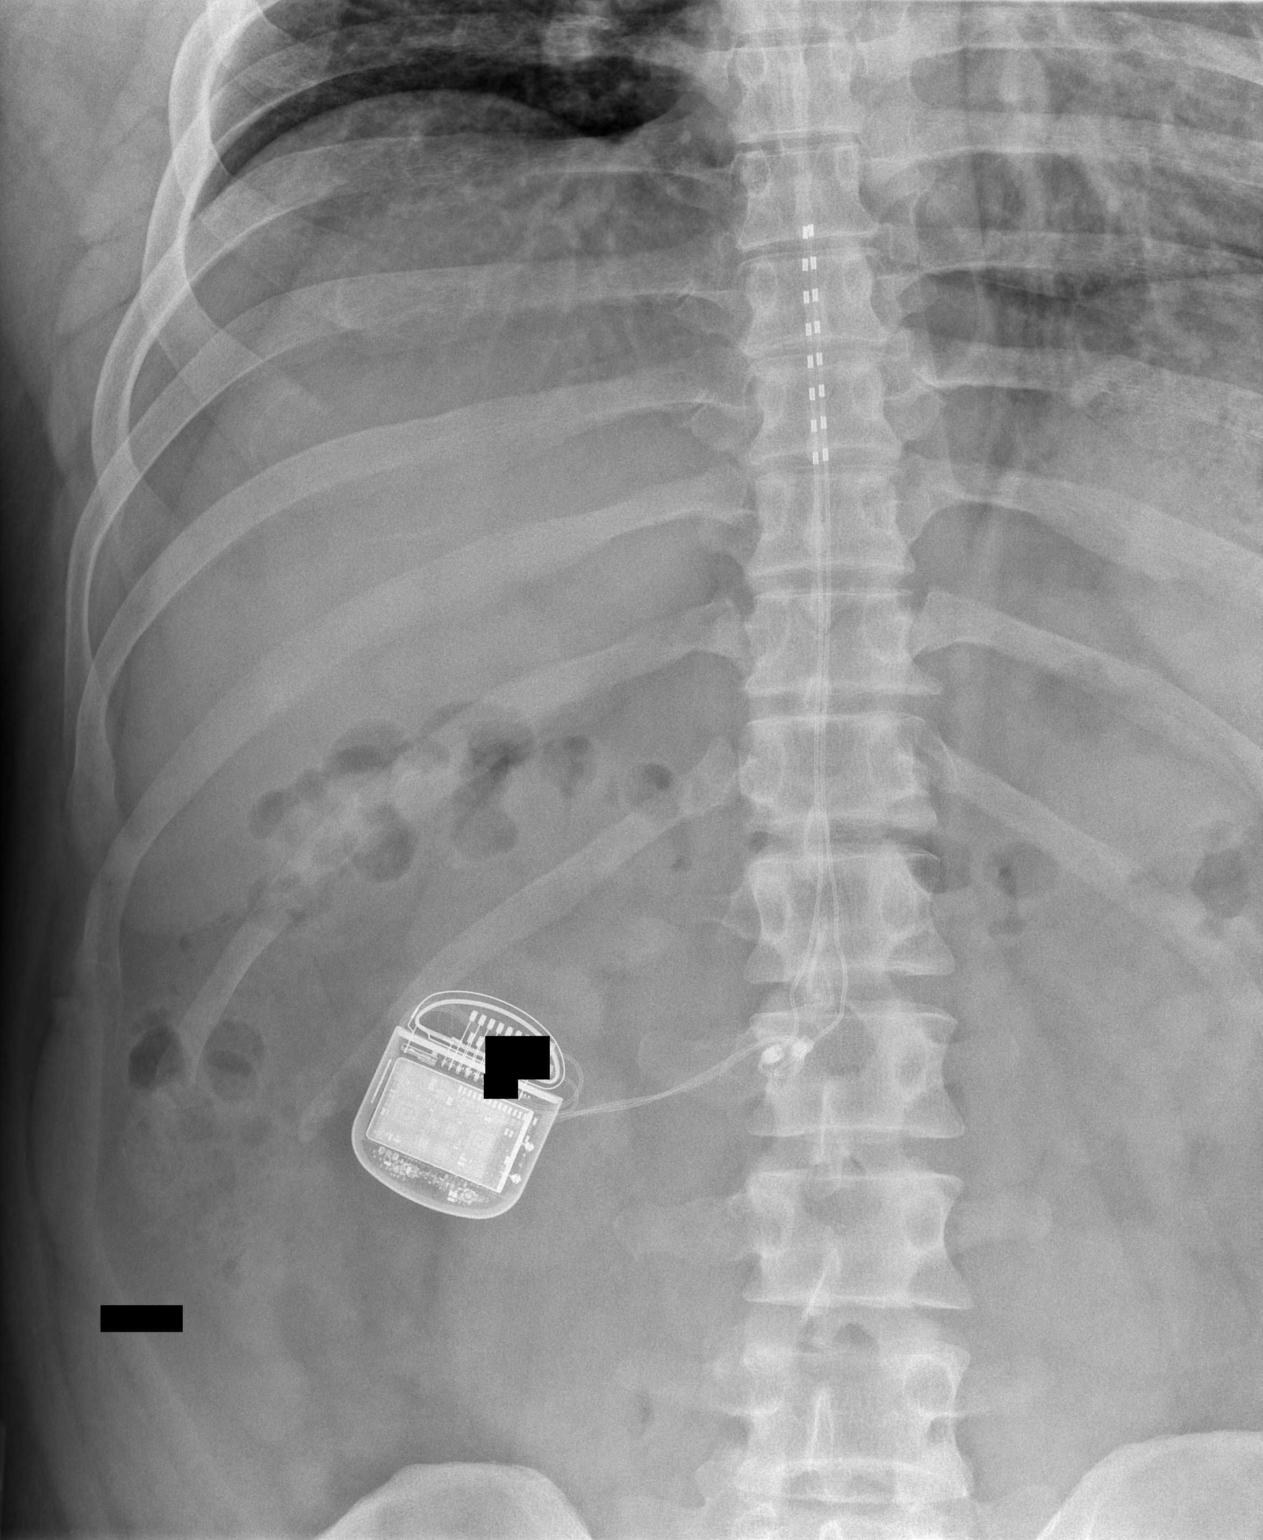
[im 2/2]
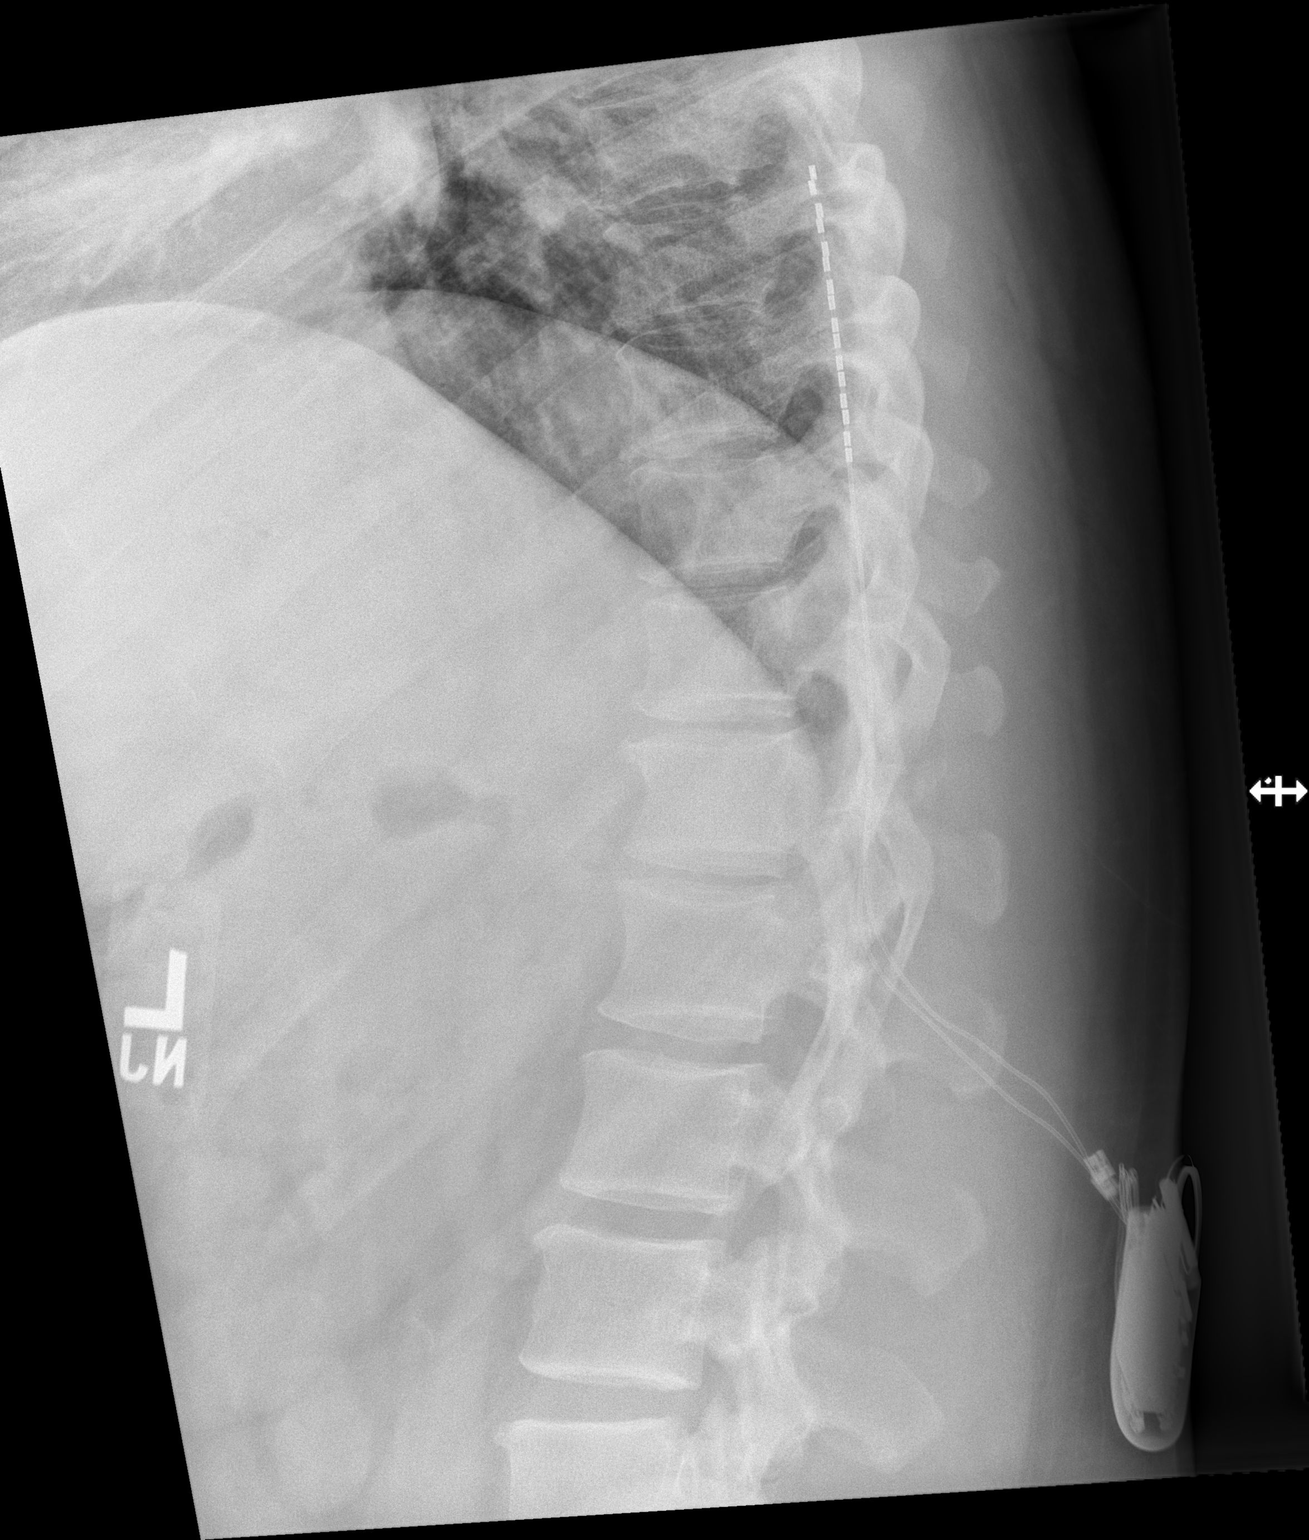

[2 of 2 positions shown; findings below may reference images not displayed]

FINDINGS: Neural stimulator generator pack in the right lower back with intact leads extending into the thoracic spinal canal terminating at T7.
IMPRESSION: The patient may undergo MRI.
